# Patient Record
Sex: Male | Born: 1945 | Race: White | Hispanic: No | Smoking: Never smoker
Health system: Southern US, Community
[De-identification: ages and names within clinical notes are randomized; demographics above are authoritative.]

## PROBLEM LIST (undated history)

## (undated) DIAGNOSIS — E119 Type 2 diabetes mellitus without complications: Secondary | ICD-10-CM

## (undated) DIAGNOSIS — I10 Essential (primary) hypertension: Secondary | ICD-10-CM

---

## 2021-01-18 ENCOUNTER — Emergency Department (INDEPENDENT_AMBULATORY_CARE_PROVIDER_SITE_OTHER)
Admission: EM | Admit: 2021-01-18 | Discharge: 2021-01-18 | Disposition: A | Discharge status: Home / Self Care | Payer: Medicare HMO | Source: Home / Self Care | Attending: Family Medicine | Admitting: Family Medicine

## 2021-01-18 ENCOUNTER — Other Ambulatory Visit: Payer: Self-pay

## 2021-01-18 DIAGNOSIS — J0100 Acute maxillary sinusitis, unspecified: Secondary | ICD-10-CM | POA: Diagnosis not present

## 2021-01-18 DIAGNOSIS — J069 Acute upper respiratory infection, unspecified: Secondary | ICD-10-CM

## 2021-01-18 HISTORY — DX: Type 2 diabetes mellitus without complications: E11.9

## 2021-01-18 HISTORY — DX: Essential (primary) hypertension: I10

## 2021-01-18 LAB — POCT INFLUENZA A/B
Influenza A, POC: NEGATIVE
Influenza B, POC: NEGATIVE

## 2021-01-18 LAB — POC SARS CORONAVIRUS 2 AG -  ED: SARS Coronavirus 2 Ag: NEGATIVE

## 2021-01-18 MED ORDER — PREDNISONE 20 MG PO TABS: 20.0000 mg | ORAL_TABLET | Freq: Two times a day (BID) | ORAL | 0 refills | Status: DC

## 2021-01-18 MED ORDER — PROMETHAZINE-DM 6.25-15 MG/5ML PO SYRP: 5.0000 mL | ORAL_SOLUTION | Freq: Four times a day (QID) | ORAL | 0 refills | Status: DC | PRN

## 2021-01-18 MED ORDER — AMOXICILLIN-POT CLAVULANATE 875-125 MG PO TABS: 1.0000 | ORAL_TABLET | Freq: Two times a day (BID) | ORAL | 0 refills | Status: DC

## 2021-01-18 NOTE — Discharge Instructions (Signed)
I am prescribing prednisone 1 pill twice a day for 5 days.  This can increase your sugar a little, be careful with your diet while you are on the prednisone Take the antibiotic, Augmentin 2 times a day for 7 days.  Take 2 doses today.  Make sure you take with food Take Phenergan DM for cough.  This may make you sleepy. Drink lots of water. See your doctor if not improving by Monday

## 2021-01-18 NOTE — ED Triage Notes (Signed)
Pt present coughing with congestion and body aches. Symptoms started four days ago . Pt tried OTC medication with no relief.

## 2021-01-18 NOTE — ED Provider Notes (Signed)
[facility]    CSN: [id number] Arrival date & time: 01/18/21  0906      History   Chief Complaint Chief Complaint  Patient presents with   Cough   Nasal Congestion    HPI Nathaniel Kramer. is a 75 y.o. male.   HPI  Patient has a lot of postnasal drip and sinus drainage.  Sore throat.  He states it is causing him to cough.  He has a harsh cough and chest pain.  He feels very tired.  Very achy.  He has been around kids and grandkids who have been sick.  Unknown exposure to flu.  He is COVID vaccinated.  Not flu vaccinated  Past Medical History:  Diagnosis Date   Diabetes mellitus without complication ([place])    Hypertension     There are no problems to display for this patient.   History reviewed. No pertinent surgical history.     Home Medications    Prior to Admission medications   Medication Sig Start Date End Date Taking? Authorizing Provider  amoxicillin-clavulanate (AUGMENTIN) 875-125 MG tablet Take 1 tablet by mouth every 12 (twelve) hours. 01/18/21  Yes Raylene Everts, MD  Blood Glucose Monitoring Suppl ([place]) w/Device KIT See admin instructions. 04/09/19  Yes [provider]  cetirizine (ZYRTEC) 10 MG chewable tablet 1 tablet 04/21/18  Yes [provider]  gabapentin (NEURONTIN) 100 MG capsule 1 capsule 01/13/20  Yes [provider]  metoprolol succinate (TOPROL-XL) 25 MG 24 hr tablet 1 tablet 07/18/20  Yes [provider]  oxyCODONE-acetaminophen (PERCOCET) 10-325 MG tablet 1 tablet as needed 12/20/20  Yes [provider]  predniSONE (DELTASONE) 20 MG tablet Take 1 tablet (20 mg total) by mouth 2 (two) times daily with a meal. 01/18/21  Yes Raylene Everts, MD  promethazine-dextromethorphan (PROMETHAZINE-DM) 6.25-15 MG/5ML syrup Take 5 mLs by mouth 4 (four) times daily as needed for cough. 01/18/21  Yes Raylene Everts, MD  Sodium Sulfate-Mag Sulfate-KCl (SUTAB) [phone] MG TABS See  admin instructions. 03/08/20  Yes [provider]  aspirin (ASPIR-LOW) 81 MG EC tablet 1 tablet    [provider]  Blood Glucose Monitoring Suppl (ACCU-CHEK GUIDE) w/Device KIT  08/25/20   [provider]  Lancets 30G MISC  08/25/20   [provider]  levothyroxine (SYNTHROID) 75 MCG tablet TAKE 1 TABLET BY MOUTH ONCE DAILY ON AN EMPTY STOMACH IN THE MORNING    [provider]  lisinopril (ZESTRIL) 40 MG tablet 1 tablet    [provider]  metFORMIN (GLUCOPHAGE-XR) 500 MG 24 hr tablet 2 tablets    [provider]  NOVOLIN 70/30 (70-30) 100 UNIT/ML injection Inject into the skin. 10/23/20   [provider]  omeprazole (PRILOSEC) 40 MG capsule 1 capsule    [provider]  simvastatin (ZOCOR) 40 MG tablet TAKE 1 TABLET BY MOUTH IN THE EVENING ONCE A DAY    [provider]  TRULICITY 8.63 OT/7.7NH SOPN Inject 0.75 mg into the skin once a week. 01/10/21   [provider]    Family History History reviewed. No pertinent family history.  Social History Social History   Tobacco Use   Smoking status: Never   Smokeless tobacco: Never     Allergies   Patient has no known allergies.   Review of Systems Review of Systems See HPI  Physical Exam Triage Vital Signs ED Triage Vitals  Enc Vitals Group     BP 01/18/21  1003 121/65     Pulse Rate 01/18/21 1003 (!) 107     Resp 01/18/21 1003 18     Temp 01/18/21 1003 98.8 F (37.1 C)     Temp Source 01/18/21 1003 Oral     SpO2 01/18/21 1003 95 %     Weight --      Height --      Head Circumference --      Peak Flow --      Pain Score 01/18/21 1004 5     Pain Loc --      Pain Edu? --      Excl. in [place]? --    No data found.  Updated Vital Signs BP 121/65 (BP Location: Left Arm)    Pulse (!) 107    Temp 98.8 F (37.1 C) (Oral)    Resp 18    SpO2 95%      Physical Exam Constitutional:      General: He is not in acute distress.     Appearance: He is well-developed.     Comments: Overweight.  Appears well  HENT:     Head: Normocephalic and atraumatic.     Right Ear: Tympanic membrane and ear canal normal.     Left Ear: Tympanic membrane and ear canal normal.     Nose: Congestion and rhinorrhea present.     Mouth/Throat:     Mouth: Mucous membranes are moist.     Pharynx: Posterior oropharyngeal erythema present.     Comments: Copious yellow postnasal drip present Eyes:     Conjunctiva/sclera: Conjunctivae normal.     Pupils: Pupils are equal, round, and reactive to light.  Cardiovascular:     Rate and Rhythm: Normal rate.     Heart sounds: Normal heart sounds.  Pulmonary:     Effort: Pulmonary effort is normal. No respiratory distress.     Breath sounds: Rhonchi present.  Abdominal:     General: There is no distension.     Palpations: Abdomen is soft.  Musculoskeletal:        General: Normal range of motion.     Cervical back: Normal range of motion.  Lymphadenopathy:     Cervical: No cervical adenopathy.  Skin:    General: Skin is warm and dry.  Neurological:     Mental Status: He is alert.     UC Treatments / Results  Labs (all labs ordered are listed, but only abnormal results are displayed) Labs Reviewed  POCT INFLUENZA A/B  POC SARS CORONAVIRUS 2 AG -  ED    EKG   Radiology No results found.  Procedures Procedures (including critical care time)  Medications Ordered in UC Medications - No data to display  Initial Impression / Assessment and Plan / UC Course  I have reviewed the triage vital signs and the nursing notes.  Pertinent labs & imaging results that were available during my care of the patient were reviewed by me and considered in my medical decision making (see chart for details).     I explained to the patient that his flu test was negative.  COVID test was negative.  He likely had a different virus.  Because he feels so sick, and has such copious discharge remember to  put him on antibiotics and prednisone and help of making him feel better.  I did warn him that the prednisone can increase his sugar.  Follow-up with primary care if not improving by next week Final Clinical Impressions(s) /  UC Diagnoses   Final diagnoses:  Acute non-recurrent maxillary sinusitis  Acute upper respiratory infection     Discharge Instructions      I am prescribing prednisone 1 pill twice a day for 5 days.  This can increase your sugar a little, be careful with your diet while you are on the prednisone Take the antibiotic, Augmentin 2 times a day for 7 days.  Take 2 doses today.  Make sure you take with food Take Phenergan DM for cough.  This may make you sleepy. Drink lots of water. See your doctor if not improving by Monday     ED Prescriptions     Medication Sig Dispense Auth. Provider   amoxicillin-clavulanate (AUGMENTIN) 875-125 MG tablet Take 1 tablet by mouth every 12 (twelve) hours. 14 tablet Raylene Everts, MD   predniSONE (DELTASONE) 20 MG tablet Take 1 tablet (20 mg total) by mouth 2 (two) times daily with a meal. 10 tablet Raylene Everts, MD   promethazine-dextromethorphan (PROMETHAZINE-DM) 6.25-15 MG/5ML syrup Take 5 mLs by mouth 4 (four) times daily as needed for cough. 118 mL Raylene Everts, MD      PDMP not reviewed this encounter.   Raylene Everts, MD 01/18/21 1257

## 2021-02-22 ENCOUNTER — Ambulatory Visit
Admission: RE | Admit: 2021-02-22 | Discharge: 2021-02-22 | Disposition: A | Discharge status: Home / Self Care | Payer: Medicare HMO | Source: Ambulatory Visit | Attending: Family Medicine | Admitting: Family Medicine

## 2021-02-22 ENCOUNTER — Other Ambulatory Visit: Payer: Self-pay | Admitting: Family Medicine

## 2021-02-22 DIAGNOSIS — M545 Low back pain, unspecified: Secondary | ICD-10-CM

## 2021-04-19 ENCOUNTER — Other Ambulatory Visit: Payer: Self-pay | Admitting: Family Medicine

## 2021-04-19 ENCOUNTER — Ambulatory Visit
Admission: RE | Admit: 2021-04-19 | Discharge: 2021-04-19 | Disposition: A | Discharge status: Home / Self Care | Payer: Medicare HMO | Source: Ambulatory Visit | Attending: Family Medicine | Admitting: Family Medicine

## 2021-04-19 ENCOUNTER — Other Ambulatory Visit: Payer: Self-pay

## 2021-04-25 ENCOUNTER — Other Ambulatory Visit: Payer: Self-pay | Admitting: Family Medicine

## 2021-04-25 DIAGNOSIS — R17 Unspecified jaundice: Secondary | ICD-10-CM

## 2021-05-02 ENCOUNTER — Other Ambulatory Visit: Payer: Medicare HMO

## 2021-05-14 ENCOUNTER — Ambulatory Visit
Admission: RE | Admit: 2021-05-14 | Discharge: 2021-05-14 | Disposition: A | Discharge status: Home / Self Care | Payer: Medicare HMO | Source: Ambulatory Visit | Attending: Family Medicine | Admitting: Family Medicine

## 2021-05-14 DIAGNOSIS — R17 Unspecified jaundice: Secondary | ICD-10-CM

## 2021-05-29 ENCOUNTER — Other Ambulatory Visit (HOSPITAL_COMMUNITY): Payer: Self-pay | Admitting: Family Medicine

## 2021-05-29 DIAGNOSIS — I10 Essential (primary) hypertension: Secondary | ICD-10-CM

## 2021-06-11 ENCOUNTER — Ambulatory Visit (HOSPITAL_COMMUNITY): Admission: RE | Admit: 2021-06-11 | Payer: Medicare PPO | Source: Ambulatory Visit

## 2022-02-12 ENCOUNTER — Telehealth: Payer: Self-pay | Admitting: Gastroenterology

## 2022-02-12 NOTE — Telephone Encounter (Signed)
Good Afternoon Dr.Mansouraty,  We received a referral on this patient to be seen for an EMR. Please advise on scheduling, thank you.

## 2022-02-13 ENCOUNTER — Other Ambulatory Visit: Payer: Self-pay

## 2022-02-13 DIAGNOSIS — Z86010 Personal history of colonic polyps: Secondary | ICD-10-CM

## 2022-02-13 MED ORDER — NA SULFATE-K SULFATE-MG SULF 17.5-3.13-1.6 GM/177ML PO SOLN
1.0000 | Freq: Once | ORAL | 0 refills | Status: AC
Start: 2022-02-13 — End: 2022-02-13

## 2022-02-13 NOTE — Telephone Encounter (Signed)
COLON EMR  has been scheduled for 04/03/22 at 915 am at [facility] with GM  Left message on machine to call back

## 2022-02-13 NOTE — Telephone Encounter (Signed)
Images from patient's recent colonoscopy have been reviewed. Patient has a large ascending colon polyp that will require endoscopic resection.  The patient can be scheduled for a colonoscopy with EMR 90-minute slot. The patient can be scheduled for a clinic visit to be done prior to the procedure. If the patient wants to wait until after clinic to schedule colonoscopy that is okay as well.  Please update Dr. Randel Pigg and I when the patient is scheduled. Thanks. GM

## 2022-02-13 NOTE — Telephone Encounter (Signed)
Colon EMR scheduled, pt instructed and medications reviewed.  Patient instructions mailed to home.  Patient to call with any questions or concerns.

## 2022-02-14 NOTE — Telephone Encounter (Signed)
PT returning call. Prescribed SuPrep and says that isnt going to work. Needs another prep option. Please advise

## 2022-03-27 ENCOUNTER — Encounter (HOSPITAL_COMMUNITY): Payer: Self-pay | Admitting: Gastroenterology

## 2022-03-27 ENCOUNTER — Encounter: Payer: Self-pay | Admitting: Gastroenterology

## 2022-04-02 NOTE — Anesthesia Preprocedure Evaluation (Signed)
Anesthesia Evaluation  Patient identified by MRN, date of birth, ID band Patient awake    Reviewed: Allergy & Precautions, NPO status , Patient's Chart, lab work & pertinent test results  History of Anesthesia Complications Negative for: history of anesthetic complications  Airway Mallampati: II  TM Distance: >3 FB Neck ROM: Full    Dental  (+) Edentulous Lower, Edentulous Upper   Pulmonary neg pulmonary ROS   Pulmonary exam normal        Cardiovascular hypertension, Pt. on medications Normal cardiovascular exam     Neuro/Psych negative neurological ROS  negative psych ROS   GI/Hepatic Neg liver ROS,GERD  Medicated,,colon polyp   Endo/Other  diabetes (on Trulicity (last dose [id number])), Type 2, Oral Hypoglycemic AgentsHypothyroidism    Renal/GU negative Renal ROS     Musculoskeletal negative musculoskeletal ROS (+)    Abdominal   Peds  Hematology negative hematology ROS (+)   Anesthesia Other Findings Day of surgery medications reviewed with patient.  Reproductive/Obstetrics                              Anesthesia Physical Anesthesia Plan  ASA: 2  Anesthesia Plan: MAC   Post-op Pain Management: Minimal or no pain anticipated   Induction:   PONV Risk Score and Plan: 1 and Propofol infusion and Treatment may vary due to age or medical condition  Airway Management Planned: Natural Airway and Simple Face Mask  Additional Equipment: None  Intra-op Plan:   Post-operative Plan:   Informed Consent: I have reviewed the patients History and Physical, chart, labs and discussed the procedure including the risks, benefits and alternatives for the proposed anesthesia with the patient or authorized representative who has indicated his/her understanding and acceptance.       Plan Discussed with: CRNA  Anesthesia Plan Comments:         Anesthesia Quick Evaluation

## 2022-04-03 ENCOUNTER — Encounter (HOSPITAL_COMMUNITY): Payer: Self-pay | Admitting: Gastroenterology

## 2022-04-03 ENCOUNTER — Ambulatory Visit (HOSPITAL_COMMUNITY)
Admission: RE | Admit: 2022-04-03 | Discharge: 2022-04-03 | Disposition: A | Discharge status: Home / Self Care | Payer: Medicare PPO | Attending: Gastroenterology | Admitting: Gastroenterology

## 2022-04-03 ENCOUNTER — Ambulatory Visit (HOSPITAL_COMMUNITY): Payer: Medicare PPO | Admitting: Anesthesiology

## 2022-04-03 ENCOUNTER — Encounter (HOSPITAL_COMMUNITY)
Admission: RE | Disposition: A | Discharge status: Home / Self Care | Payer: Self-pay | Source: Home / Self Care | Attending: Gastroenterology

## 2022-04-03 ENCOUNTER — Other Ambulatory Visit: Payer: Self-pay

## 2022-04-03 ENCOUNTER — Ambulatory Visit (HOSPITAL_BASED_OUTPATIENT_CLINIC_OR_DEPARTMENT_OTHER): Payer: Medicare PPO | Admitting: Anesthesiology

## 2022-04-03 DIAGNOSIS — Z79899 Other long term (current) drug therapy: Secondary | ICD-10-CM | POA: Diagnosis not present

## 2022-04-03 DIAGNOSIS — K219 Gastro-esophageal reflux disease without esophagitis: Secondary | ICD-10-CM | POA: Diagnosis not present

## 2022-04-03 DIAGNOSIS — K635 Polyp of colon: Secondary | ICD-10-CM

## 2022-04-03 DIAGNOSIS — D127 Benign neoplasm of rectosigmoid junction: Secondary | ICD-10-CM | POA: Diagnosis not present

## 2022-04-03 DIAGNOSIS — Z7984 Long term (current) use of oral hypoglycemic drugs: Secondary | ICD-10-CM

## 2022-04-03 DIAGNOSIS — K641 Second degree hemorrhoids: Secondary | ICD-10-CM | POA: Diagnosis not present

## 2022-04-03 DIAGNOSIS — Z86010 Personal history of colonic polyps: Secondary | ICD-10-CM

## 2022-04-03 DIAGNOSIS — E119 Type 2 diabetes mellitus without complications: Secondary | ICD-10-CM | POA: Diagnosis not present

## 2022-04-03 DIAGNOSIS — Z7985 Long-term (current) use of injectable non-insulin antidiabetic drugs: Secondary | ICD-10-CM | POA: Insufficient documentation

## 2022-04-03 DIAGNOSIS — D122 Benign neoplasm of ascending colon: Secondary | ICD-10-CM

## 2022-04-03 DIAGNOSIS — I10 Essential (primary) hypertension: Secondary | ICD-10-CM | POA: Insufficient documentation

## 2022-04-03 HISTORY — PX: COLONOSCOPY WITH PROPOFOL: SHX5780

## 2022-04-03 HISTORY — PX: POLYPECTOMY: SHX5525

## 2022-04-03 HISTORY — PX: ENDOSCOPIC MUCOSAL RESECTION: SHX6839

## 2022-04-03 HISTORY — PX: HEMOSTASIS CLIP PLACEMENT: SHX6857

## 2022-04-03 HISTORY — PX: SUBMUCOSAL LIFTING INJECTION: SHX6855

## 2022-04-03 LAB — GLUCOSE, CAPILLARY: Glucose-Capillary: 188 mg/dL — ABNORMAL HIGH (ref 70–99)

## 2022-04-03 SURGERY — COLONOSCOPY WITH PROPOFOL
Anesthesia: Monitor Anesthesia Care

## 2022-04-03 MED ORDER — SODIUM CHLORIDE 0.9 % IV SOLN: INTRAVENOUS | Status: DC

## 2022-04-03 MED ORDER — LACTATED RINGERS IV SOLN
INTRAVENOUS | Status: AC | PRN
  Administered 2022-04-03: 1000 mL via INTRAVENOUS

## 2022-04-03 MED ORDER — ONDANSETRON HCL 4 MG/2ML IJ SOLN
INTRAMUSCULAR | Status: DC | PRN
  Administered 2022-04-03: 4 mg via INTRAVENOUS

## 2022-04-03 MED ORDER — PROPOFOL 500 MG/50ML IV EMUL
INTRAVENOUS | Status: DC | PRN
  Administered 2022-04-03: 90 ug/kg/min via INTRAVENOUS

## 2022-04-03 MED ORDER — LIDOCAINE HCL (CARDIAC) PF 100 MG/5ML IV SOSY
PREFILLED_SYRINGE | INTRAVENOUS | Status: DC | PRN
  Administered 2022-04-03: 100 mg via INTRAVENOUS

## 2022-04-03 MED ORDER — PROPOFOL 10 MG/ML IV BOLUS
INTRAVENOUS | Status: DC | PRN
  Administered 2022-04-03: 40 mg via INTRAVENOUS
  Administered 2022-04-03: 80 mg via INTRAVENOUS

## 2022-04-03 MED ORDER — PROPOFOL 10 MG/ML IV BOLUS
INTRAVENOUS | Status: AC
  Filled 2022-04-03: qty 20

## 2022-04-03 SURGICAL SUPPLY — 22 items

## 2022-04-03 NOTE — Anesthesia Postprocedure Evaluation (Signed)
Anesthesia Post Note  Patient: [employer]  Procedure(s) Performed: COLONOSCOPY WITH PROPOFOL ENDOSCOPIC MUCOSAL RESECTION SUBMUCOSAL LIFTING INJECTION POLYPECTOMY     Patient location during evaluation: PACU Anesthesia Type: MAC Level of consciousness: awake and alert Pain management: pain level controlled Vital Signs Assessment: post-procedure vital signs reviewed and stable Respiratory status: spontaneous breathing, nonlabored ventilation and respiratory function stable Cardiovascular status: blood pressure returned to baseline Postop Assessment: no apparent nausea or vomiting Anesthetic complications: no   No notable events documented.  Last Vitals:  Vitals:   04/03/22 1039 04/03/22 1049  BP: (!) 127/43 (!) 137/59  Pulse: 72 67  Resp: 14 14  Temp: 36.5 C   SpO2: 98% 100%    Last Pain:  Vitals:   04/03/22 1049  TempSrc:   PainSc: 0-No pain                 Marthenia Rolling

## 2022-04-03 NOTE — H&P (Signed)
GASTROENTEROLOGY PROCEDURE H&P NOTE   Primary Care Physician: Nathaniel Nova, MD  HPI: Nathaniel Kramer. is a 77 y.o. male who presents for Colonoscopy for attempt at [facility] resection.  Past Medical History:  Diagnosis Date   Diabetes mellitus without complication ([place])    Hypertension    History reviewed. No pertinent surgical history. Current Facility-Administered Medications  Medication Dose Route Frequency Provider Last Rate Last Admin   0.9 %  sodium chloride infusion   Intravenous Continuous Mansouraty, Telford Nab., MD        Current Facility-Administered Medications:    0.9 %  sodium chloride infusion, , Intravenous, Continuous, Mansouraty, Telford Nab., MD No Known Allergies History reviewed. No pertinent family history. Social History   Socioeconomic History   Marital status: Widowed    Spouse name: Not on file   Number of children: Not on file   Years of education: Not on file   Highest education level: Not on file  Occupational History   Not on file  Tobacco Use   Smoking status: Never   Smokeless tobacco: Never  Substance and Sexual Activity   Alcohol use: Not on file   Drug use: Not on file   Sexual activity: Not on file  Other Topics Concern   Not on file  Social History Narrative   Not on file   Social Determinants of Health   Financial Resource Strain: Not on file  Food Insecurity: Not on file  Transportation Needs: Not on file  Physical Activity: Not on file  Stress: Not on file  Social Connections: Not on file  Intimate Partner Violence: Not on file    Physical Exam: Today's Vitals   03/27/22 1529  Weight: 131.1 kg  Height: '6\' 1"'$  (1.854 m)   Body mass index is 38.13 kg/m. GEN: NAD EYE: Sclerae anicteric ENT: MMM CV: Non-tachycardic GI: Soft, NT/ND NEURO:  Alert & Oriented x 3  Lab Results: No results for input(s): "WBC", "HGB", "HCT", "PLT" in the last 72 hours. BMET No results for input(s): "NA", "K", "CL", "CO2",  "GLUCOSE", "BUN", "CREATININE", "CALCIUM" in the last 72 hours. LFT No results for input(s): "PROT", "ALBUMIN", "AST", "ALT", "ALKPHOS", "BILITOT", "BILIDIR", "IBILI" in the last 72 hours. PT/INR No results for input(s): "LABPROT", "INR" in the last 72 hours.   Impression / Plan: This is a 77 y.o.male who presents for Colonoscopy for attempt at [facility] resection.   Based upon the description and endoscopic pictures I do feel that it is reasonable to pursue an Advanced Polypectomy attempt of the polyp/lesion.  We discussed some of the techniques of advanced polypectomy which include Endoscopic Mucosal Resection, OVESCO Full-Thickness Resection, Endorotor Morcellation, and Tissue Ablation via Fulguration.  We also reviewed images of typical techniques as noted above.  The risks and benefits of endoscopic evaluation were discussed with the patient; these include but are not limited to the risk of perforation, infection, bleeding, missed lesions, lack of diagnosis, severe illness requiring hospitalization, as well as anesthesia and sedation related illnesses.  During attempts at advanced resection, the risks of bleeding and perforation/leak are increased as opposed to diagnostic and screening procedures, and that was discussed with the patient as well.   In addition, I explained that with the possible need for piecemeal resection, subsequent short-interval endoscopic evaluation for follow up and potential retreatment of the lesion/area may be necessary.  I did offer, a referral to surgery in order for patient to have opportunity to discuss surgical management/intervention prior  to finalizing decision for attempt at endoscopic removal, however, the patient deferred on this.  If, after attempt at removal of the polyp/lesion, it is found that the patient has a complication or that an invasive lesion or malignant lesion is found, or that the polyp/lesion continues to recur, the patient is aware and understands that  surgery may still be indicated/required.  All patient questions were answered, to the best of my ability, and the patient agrees to the aforementioned plan of action with follow-up as indicated.  The risks and benefits of endoscopic evaluation/treatment were discussed with the patient and/or family; these include but are not limited to the risk of perforation, infection, bleeding, missed lesions, lack of diagnosis, severe illness requiring hospitalization, as well as anesthesia and sedation related illnesses.  The patient's history has been reviewed, patient examined, no change in status, and deemed stable for procedure.  The patient and/or family is agreeable to proceed.    Justice Britain, MD [facility] Advanced Endoscopy Office # [id number]

## 2022-04-03 NOTE — Op Note (Signed)
[facility] Patient Name: [facility] Procedure Date: 04/03/2022 MRN: [id number] Attending MD: Justice Britain , MD, [id number] Date of Birth: [birth year] CSN: [id number] Age: 77 Admit Type: Outpatient Procedure:                Colonoscopy Indications:              Excision of colonic polyp Providers:                Justice Britain, MD, Burtis Junes, RN, Darliss Cheney,                            Technician Referring MD:             Danton Clap DO, DO, Syed A. Manuella Ghazi Medicines:                Monitored Anesthesia Care Complications:            No immediate complications. Estimated Blood Loss:     Estimated blood loss was minimal. Procedure:                Pre-Anesthesia Assessment:                           - Prior to the procedure, a History and Physical                            was performed, and patient medications and                            allergies were reviewed. The patient's tolerance of                            previous anesthesia was also reviewed. The risks                            and benefits of the procedure and the sedation                            options and risks were discussed with the patient.                            All questions were answered, and informed consent                            was obtained. Prior Anticoagulants: The patient has                            taken no anticoagulant or antiplatelet agents                            except for aspirin. ASA Grade Assessment: III - A                            patient with severe systemic disease. After  reviewing the risks and benefits, the patient was                            deemed in satisfactory condition to undergo the                            procedure.                           After obtaining informed consent, the colonoscope                            was passed under direct vision. Throughout the                            procedure,  the patient's blood pressure, pulse, and                            oxygen saturations were monitored continuously. The                            [id number]) Olympus colonoscope was                            introduced through the anus and advanced to the 3                            cm into the ileum. The colonoscopy was performed                            without difficulty. The patient tolerated the                            procedure. The quality of the bowel preparation was                            adequate. The terminal ileum, ileocecal valve,                            appendiceal orifice, and rectum were photographed. Scope In: 9:42:44 AM Scope Out: 10:31:56 AM Scope Withdrawal Time: 0 hours 45 minutes 44 seconds  Total Procedure Duration: 0 hours 49 minutes 12 seconds  Findings:      The digital rectal exam findings include hemorrhoids. Pertinent       negatives include no palpable rectal lesions.      The terminal ileum and ileocecal valve appeared normal.      A 30 mm polyp was found in the mid ascending colon (tattoo noted more       proximally and away from the lesion). The polyp was non-granular lateral       spreading. Preparations were made for attempt at mucosal resection.       Demarcation of the lesion was performed with high-definition white light       and narrow band imaging to clearly identify the boundaries of the       lesion. Olympus  Endoclot was injected to raise the lesion. Piecemeal       mucosal resection using a snare was performed. Resection and retrieval       were complete. Resected tissue margins were examined and clear of polyp       tissue. Coagulation for tissue destruction using snare tip soft       coagulation to the margin was successful. To prevent bleeding after       mucosal resection, four hemostatic clips were successfully placed (MR       conditional). Clip manufacturer: [employer]. There was no       bleeding during, or  at the end, of the procedure.      A 3 mm polyp was found in the recto-sigmoid colon. The polyp was       sessile. The polyp was removed with a cold snare. Resection and       retrieval were complete.      Normal mucosa was found in the entire colon otherwise.      Non-bleeding non-thrombosed internal hemorrhoids were found during       retroflexion, during perianal exam and during digital exam. The       hemorrhoids were Grade II (internal hemorrhoids that prolapse but reduce       spontaneously). Impression:               - Hemorrhoids found on digital rectal exam.                           - The examined portion of the ileum was normal.                           - One 30 mm polyp in the mid ascending colon                            (tattoo noted more proximally and away from the                            lesion), removed with piecemeal mucosal resection.                            Resected and retrieved. Treated with STSC to the                            margin. Clips (MR conditional) were placed. Clip                            manufacturer: [employer].                           - One 3 mm polyp at the recto-sigmoid colon,                            removed with a cold snare. Resected and retrieved.                           - Normal mucosa in the entire examined colon  otherwise.                           - Non-bleeding non-thrombosed internal hemorrhoids. Moderate Sedation:      Not Applicable - Patient had care per Anesthesia. Recommendation:           - The patient will be observed post-procedure,                            until all discharge criteria are met.                           - Discharge patient to home.                           - Patient has a contact number available for                            emergencies. The signs and symptoms of potential                            delayed complications were discussed with the                             patient. Return to normal activities tomorrow.                            Written discharge instructions were provided to the                            patient.                           - High fiber diet.                           - Use FiberCon 1-2 tablets PO daily.                           - Minimize NSAIDs as able for next 1 to 2 weeks.                           - Continue present medications.                           - Await pathology results.                           - Repeat colonoscopy in 6 to 9 months for                            surveillance.                           - The findings and recommendations were discussed  with the patient.                           - The findings and recommendations were discussed                            with the patient's family. Procedure Code(s):        --- Professional ---                           [phone], Colonoscopy, flexible; with endoscopic                            mucosal resection                           45385, 54, Colonoscopy, flexible; with removal of                            tumor(s), polyp(s), or other lesion(s) by snare                            technique Diagnosis Code(s):        --- Professional ---                           K64.1, Second degree hemorrhoids                           D12.2, Benign neoplasm of ascending colon                           D12.7, Benign neoplasm of rectosigmoid junction                           K63.5, Polyp of colon CPT copyright 2022 American [employer] Association. All rights reserved. The codes documented in this report are preliminary and upon coder review may  be revised to meet current compliance requirements. Justice Britain, MD 04/03/2022 10:54:10 AM Number of Addenda: 0

## 2022-04-03 NOTE — Discharge Instructions (Signed)

## 2022-04-03 NOTE — Transfer of Care (Signed)
Immediate Anesthesia Transfer of Care Note  Patient: [employer]  Procedure(s) Performed: COLONOSCOPY WITH PROPOFOL ENDOSCOPIC MUCOSAL RESECTION SUBMUCOSAL LIFTING INJECTION POLYPECTOMY  Patient Location: PACU and Endoscopy Unit  Anesthesia Type:MAC  Level of Consciousness: awake, alert , oriented, patient cooperative, and responds to stimulation  Airway & Oxygen Therapy: Patient Spontanous Breathing and Patient connected to face mask oxygen  Post-op Assessment: Report given to RN, Post -op Vital signs reviewed and stable, and Patient moving all extremities  Post vital signs: Reviewed and stable  Last Vitals:  Vitals Value Taken Time  BP 127/43 04/03/22 1040  Temp 36.5 C 04/03/22 1039  Pulse 68 04/03/22 1044  Resp 17 04/03/22 1044  SpO2 99 % 04/03/22 1044  Vitals shown include unvalidated device data.  Last Pain:  Vitals:   04/03/22 1039  TempSrc: Temporal  PainSc: 0-No pain         Complications: No notable events documented.

## 2022-04-04 LAB — SURGICAL PATHOLOGY

## 2022-04-06 ENCOUNTER — Encounter: Payer: Self-pay | Admitting: Gastroenterology

## 2022-04-07 ENCOUNTER — Encounter (HOSPITAL_COMMUNITY): Payer: Self-pay | Admitting: Gastroenterology

## 2022-04-18 ENCOUNTER — Encounter: Payer: Self-pay | Admitting: Family Medicine

## 2022-04-18 ENCOUNTER — Other Ambulatory Visit: Payer: Self-pay | Admitting: Family Medicine

## 2022-04-18 DIAGNOSIS — T148XXA Other injury of unspecified body region, initial encounter: Secondary | ICD-10-CM

## 2022-04-18 DIAGNOSIS — R609 Edema, unspecified: Secondary | ICD-10-CM

## 2022-05-08 ENCOUNTER — Ambulatory Visit
Admission: RE | Admit: 2022-05-08 | Discharge: 2022-05-08 | Disposition: A | Discharge status: Home / Self Care | Payer: Medicare PPO | Source: Ambulatory Visit | Attending: Family Medicine | Admitting: Family Medicine

## 2022-05-08 DIAGNOSIS — T148XXA Other injury of unspecified body region, initial encounter: Secondary | ICD-10-CM

## 2022-05-08 DIAGNOSIS — R609 Edema, unspecified: Secondary | ICD-10-CM

## 2022-05-21 ENCOUNTER — Other Ambulatory Visit: Payer: Self-pay | Admitting: Family Medicine

## 2022-05-21 DIAGNOSIS — R17 Unspecified jaundice: Secondary | ICD-10-CM

## 2022-06-20 ENCOUNTER — Ambulatory Visit
Admission: RE | Admit: 2022-06-20 | Discharge: 2022-06-20 | Disposition: A | Discharge status: Home / Self Care | Payer: Medicare PPO | Source: Ambulatory Visit | Attending: Family Medicine | Admitting: Family Medicine

## 2022-06-20 DIAGNOSIS — R17 Unspecified jaundice: Secondary | ICD-10-CM

## 2022-10-25 ENCOUNTER — Other Ambulatory Visit: Payer: Self-pay | Admitting: Family Medicine

## 2022-11-07 ENCOUNTER — Ambulatory Visit
Admission: RE | Admit: 2022-11-07 | Discharge: 2022-11-07 | Disposition: A | Discharge status: Home / Self Care | Payer: Medicare PPO | Source: Ambulatory Visit | Attending: Family Medicine | Admitting: Family Medicine

## 2024-02-27 IMAGING — US US ABDOMEN LIMITED
1 series · 14 of 25 positions shown · non-contrast
Comparison: L-spine XRs, 02/22/2021.

CLINICAL DATA: Elevated bilirubin.

EXAM:
ULTRASOUND ABDOMEN LIMITED RIGHT UPPER QUADRANT

[Series 1: us abdomen limited · 0.34mm/px · 14 of 35 slices shown]
[im 1/35]
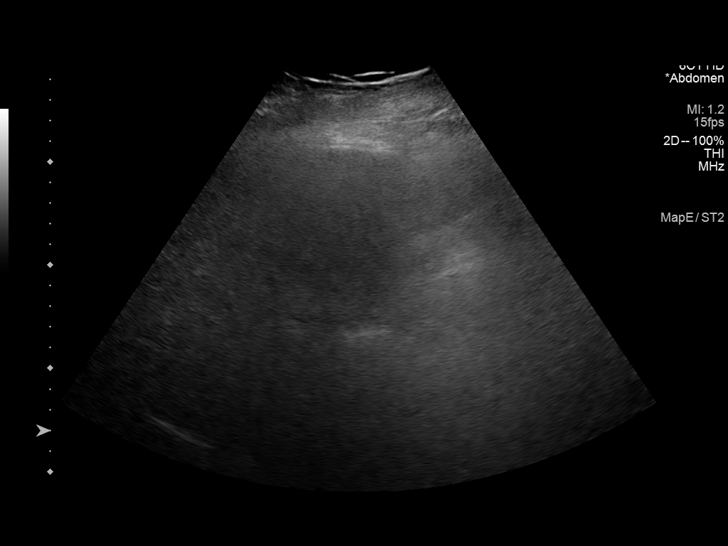
[im 3/35]
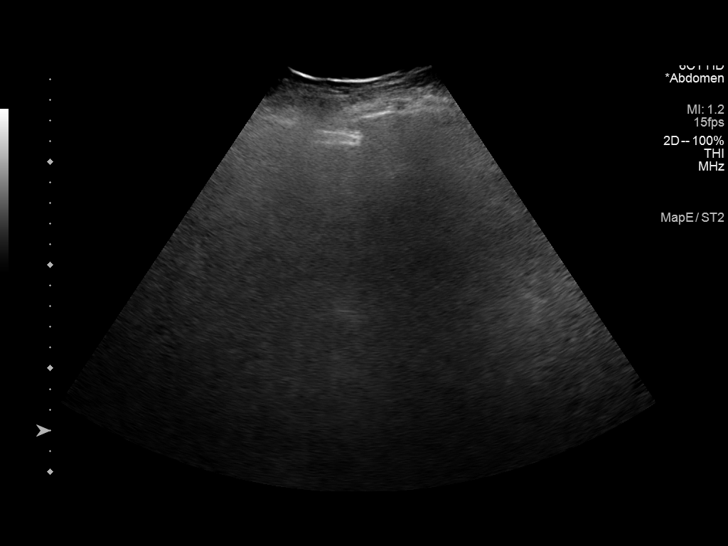
[im 6/35]
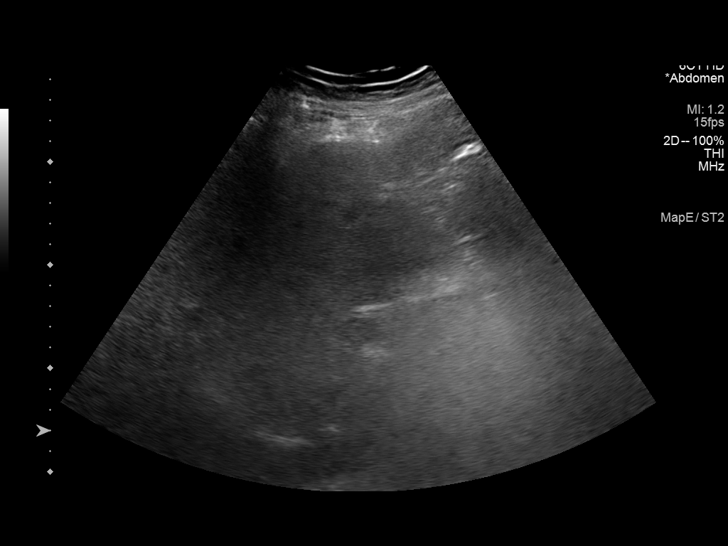
[im 9/35]
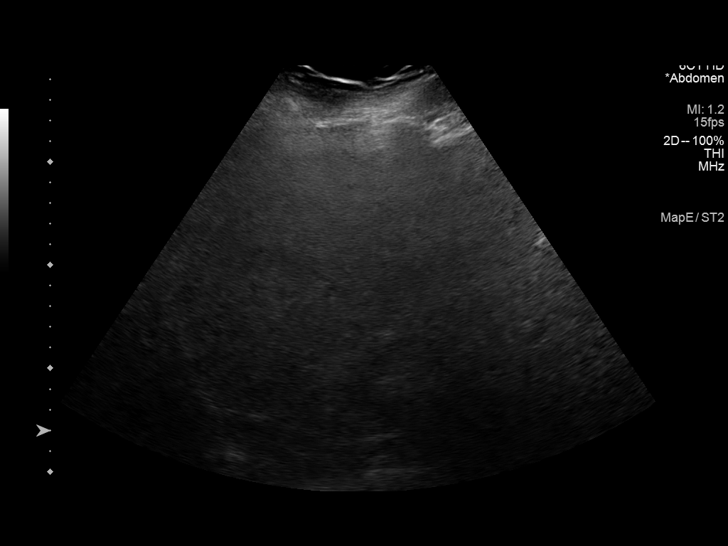
[im 12/35]
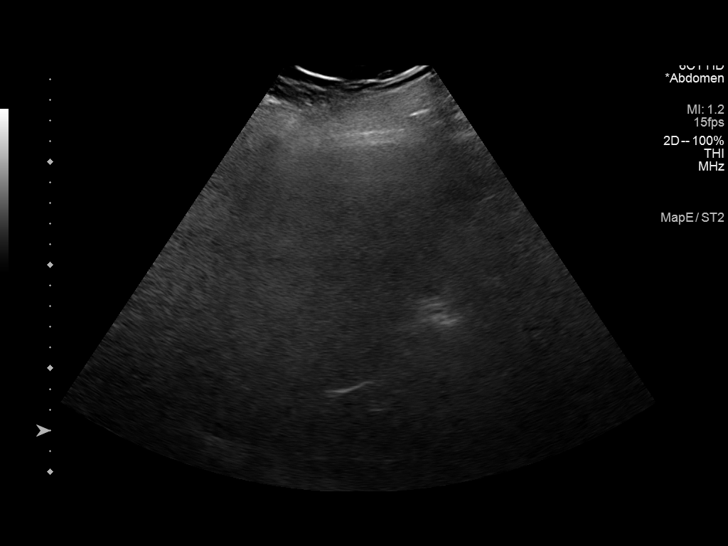
[im 13/35]
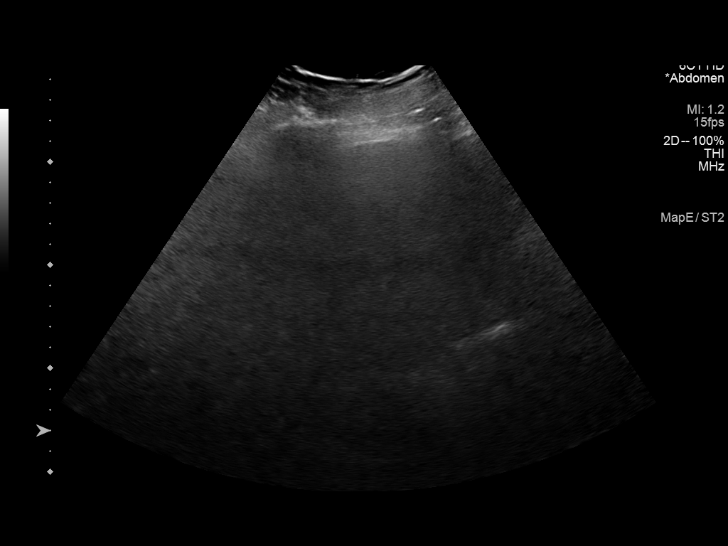
[im 16/35]
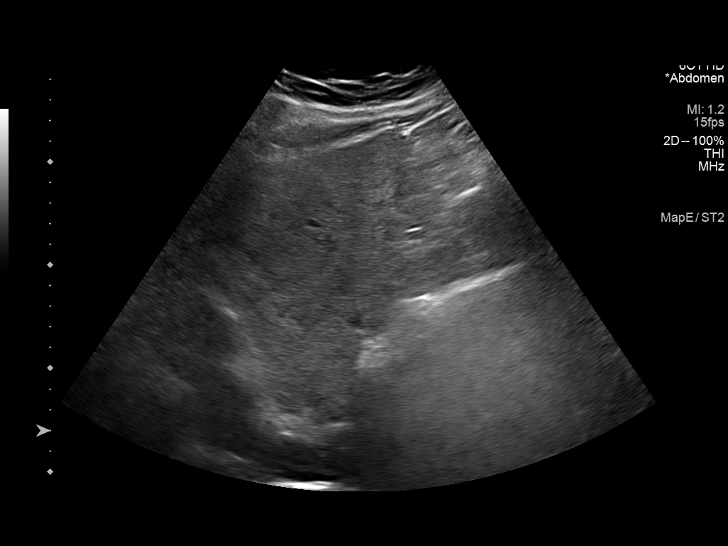
[im 19/35]
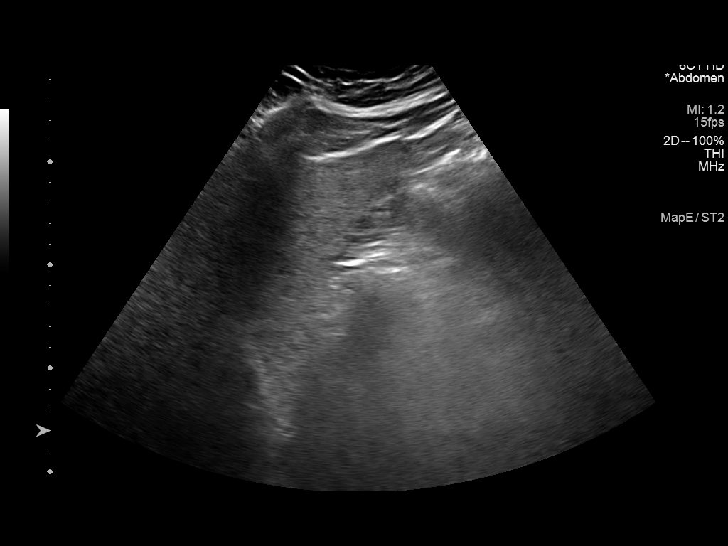
[im 22/35]
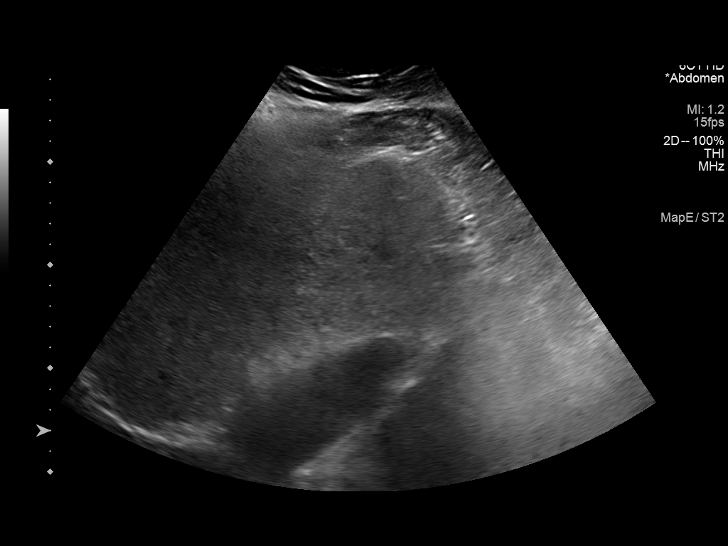
[im 23/35]
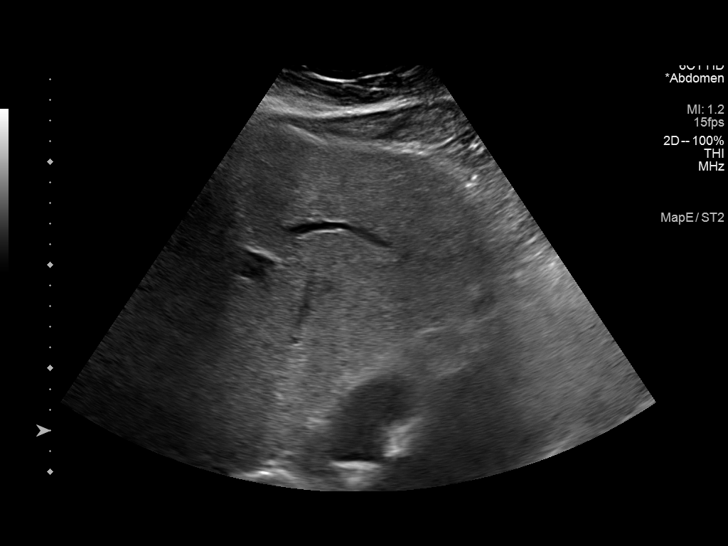
[im 26/35]
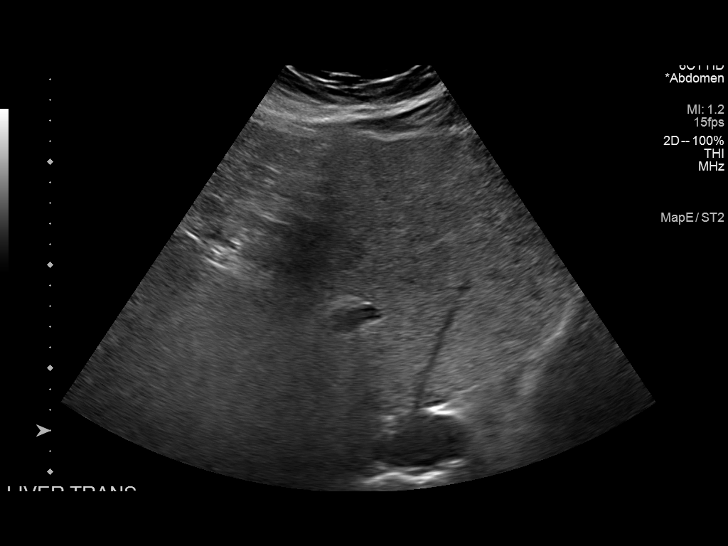
[im 29/35]
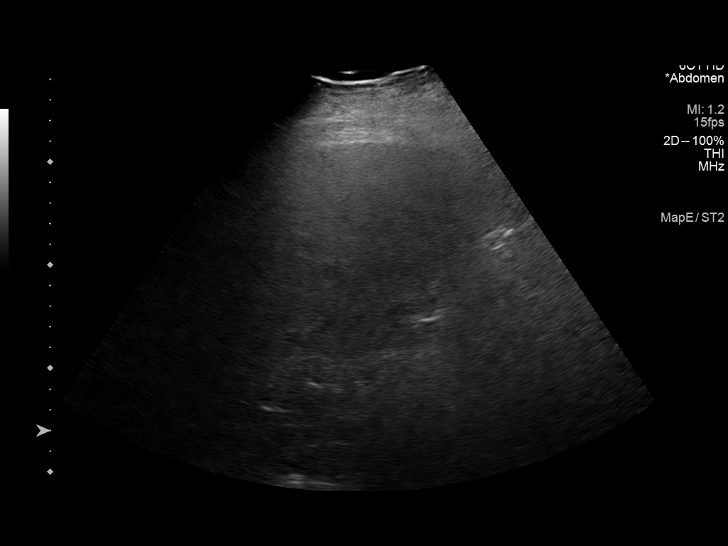
[im 32/35]
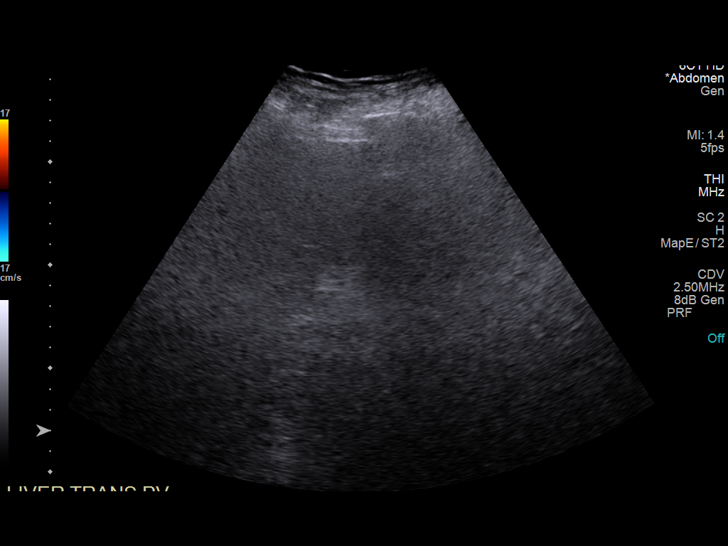
[im 35/35]
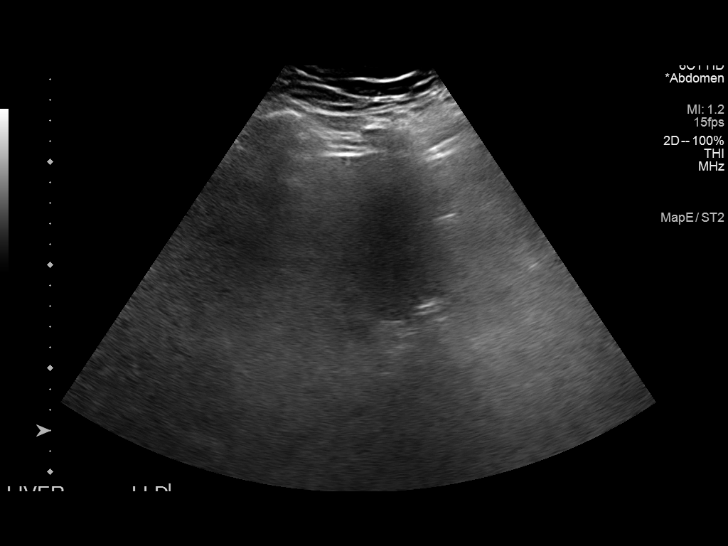

[14 of 25 positions shown; findings below may reference images not displayed]

FINDINGS: Suboptimal evaluation, with poor acoustic penetration secondary to
patient habitus.

Gallbladder:

Surgically absent

Common bile duct:

Diameter: 0.2 cm

Liver:

No focal lesion identified though visualization is extremely
limited. Increased hepatic echogenicity with coarse parenchymal
echotexture. Portal vein is patent on color Doppler imaging with
normal direction of blood flow towards the liver.

Other: No perihepatic ascites.
IMPRESSION: Suboptimal evaluation, within these constraints;

1. Echogenic liver. Findings most commonly seen in hepatic
steatosis, though may also represent hepatitis and/or fibrosis.
2. Cholecystectomy.
